# Patient Record
Sex: Male | Born: 1970 | Marital: Single
Health system: Southern US, Community
[De-identification: ages and names within clinical notes are randomized; demographics above are authoritative.]

---

## 2011-09-05 ENCOUNTER — Ambulatory Visit: Payer: Self-pay | Admitting: Oncology

## 2011-09-23 ENCOUNTER — Inpatient Hospital Stay: Payer: Self-pay | Admitting: Internal Medicine

## 2011-09-23 LAB — CBC
MCH: 27.1 pg (ref 26.0–34.0)
MCHC: 33.5 g/dL (ref 32.0–36.0)
MCV: 81 fL (ref 80–100)
Platelet: 87 10*3/uL — ABNORMAL LOW (ref 150–440)
RBC: 3.66 10*6/uL — ABNORMAL LOW (ref 4.40–5.90)
RDW: 16.3 % — ABNORMAL HIGH (ref 11.5–14.5)

## 2011-09-23 LAB — URINALYSIS, COMPLETE
Glucose,UR: NEGATIVE mg/dL (ref 0–75)
Hyaline Cast: 1
Ketone: NEGATIVE
Nitrite: NEGATIVE
Ph: 6 (ref 4.5–8.0)
Specific Gravity: 1.015 (ref 1.003–1.030)
WBC UR: 2 /HPF (ref 0–5)

## 2011-09-23 LAB — COMPREHENSIVE METABOLIC PANEL
Alkaline Phosphatase: 90 U/L (ref 50–136)
Anion Gap: 12 (ref 7–16)
BUN: 10 mg/dL (ref 7–18)
Calcium, Total: 8.2 mg/dL — ABNORMAL LOW (ref 8.5–10.1)
Chloride: 100 mmol/L (ref 98–107)
Co2: 25 mmol/L (ref 21–32)
EGFR (African American): >60
EGFR (Non-African Amer.): >60
Osmolality: 275 (ref 275–301)
Potassium: 2.7 mmol/L — ABNORMAL LOW (ref 3.5–5.1)
SGOT(AST): 61 U/L — ABNORMAL HIGH (ref 15–37)
SGPT (ALT): 29 U/L

## 2011-09-23 LAB — IRON AND TIBC
Iron Bind.Cap.(Total): 207 ug/dL — ABNORMAL LOW (ref 250–450)
Iron: 15 ug/dL — ABNORMAL LOW (ref 65–175)
Unbound Iron-Bind.Cap.: 192 ug/dL

## 2011-09-23 LAB — SEDIMENTATION RATE: Erythrocyte Sed Rate: 76 mm/hr — ABNORMAL HIGH (ref 0–15)

## 2011-09-23 LAB — RAPID INFLUENZA A&B ANTIGENS

## 2011-09-24 LAB — COMPREHENSIVE METABOLIC PANEL
Alkaline Phosphatase: 112 U/L (ref 50–136)
Anion Gap: 9 (ref 7–16)
BUN: 15 mg/dL (ref 7–18)
Bilirubin,Total: 1.8 mg/dL — ABNORMAL HIGH (ref 0.2–1.0)
Chloride: 101 mmol/L (ref 98–107)
Creatinine: 1.24 mg/dL (ref 0.60–1.30)
EGFR (African American): >60
Glucose: 111 mg/dL — ABNORMAL HIGH (ref 65–99)
SGOT(AST): 89 U/L — ABNORMAL HIGH (ref 15–37)
SGPT (ALT): 40 U/L
Total Protein: 7 g/dL (ref 6.4–8.2)

## 2011-09-24 LAB — HEMOGLOBIN A1C: Hemoglobin A1C: 5 % (ref 4.2–6.3)

## 2011-09-24 LAB — OCCULT BLOOD X 1 CARD TO LAB, STOOL: Occult Blood, Feces: NEGATIVE

## 2011-09-24 LAB — LIPID PANEL
Cholesterol: 126 mg/dL (ref 0–200)
Ldl Cholesterol, Calc: 84 mg/dL (ref 0–100)
Triglycerides: 131 mg/dL (ref 0–200)
VLDL Cholesterol, Calc: 26 mg/dL (ref 5–40)

## 2011-09-24 LAB — CBC WITH DIFFERENTIAL/PLATELET
Basophil %: 0 %
Eosinophil #: 0 10*3/uL (ref 0.0–0.7)
Eosinophil %: 0 %
HCT: 30.5 % — ABNORMAL LOW (ref 40.0–52.0)
HGB: 10.1 g/dL — ABNORMAL LOW (ref 13.0–18.0)
Lymphocyte #: 0.2 10*3/uL — ABNORMAL LOW (ref 1.0–3.6)
MCH: 27 pg (ref 26.0–34.0)
Monocyte %: 4.6 %
Neutrophil #: 2.3 10*3/uL (ref 1.4–6.5)
Neutrophil %: 88.5 %
Platelet: 56 10*3/uL — ABNORMAL LOW (ref 150–440)
RBC: 3.76 10*6/uL — ABNORMAL LOW (ref 4.40–5.90)

## 2011-09-24 LAB — PROTIME-INR
INR: 1.3
Prothrombin Time: 16.3 secs — ABNORMAL HIGH (ref 11.5–14.7)

## 2011-09-24 LAB — URINE CULTURE

## 2011-09-24 LAB — CK: CK, Total: 42 U/L (ref 35–232)

## 2011-09-24 LAB — FOLATE: Folic Acid: 6.4 ng/mL (ref 3.1–100.0)

## 2011-09-25 LAB — BASIC METABOLIC PANEL
Anion Gap: 10 (ref 7–16)
BUN: 10 mg/dL (ref 7–18)
Calcium, Total: 8 mg/dL — ABNORMAL LOW (ref 8.5–10.1)
Chloride: 102 mmol/L (ref 98–107)
Co2: 24 mmol/L (ref 21–32)
Creatinine: 0.87 mg/dL (ref 0.60–1.30)

## 2011-09-25 LAB — RAPID HIV-1/2 QL/CONFIRM: HIV-1/2,Rapid Ql: NEGATIVE

## 2011-09-25 LAB — MAGNESIUM: Magnesium: 1.7 mg/dL — ABNORMAL LOW

## 2011-09-26 LAB — BASIC METABOLIC PANEL
Anion Gap: 11 (ref 7–16)
BUN: 7 mg/dL (ref 7–18)
Creatinine: 0.86 mg/dL (ref 0.60–1.30)
EGFR (African American): >60
EGFR (Non-African Amer.): >60
Glucose: 94 mg/dL (ref 65–99)
Osmolality: 275 (ref 275–301)

## 2011-09-26 LAB — CBC WITH DIFFERENTIAL/PLATELET
Eosinophil %: 0.2 %
HCT: 24.3 % — ABNORMAL LOW (ref 40.0–52.0)
HGB: 8.1 g/dL — ABNORMAL LOW (ref 13.0–18.0)
Lymphocyte #: 0.6 10*3/uL — ABNORMAL LOW (ref 1.0–3.6)
MCH: 27.1 pg (ref 26.0–34.0)
MCV: 81 fL (ref 80–100)
Monocyte #: 0.2 10*3/uL (ref 0.0–0.7)
Monocyte %: 10.6 %
Neutrophil #: 1 10*3/uL — ABNORMAL LOW (ref 1.4–6.5)
RBC: 2.99 10*6/uL — ABNORMAL LOW (ref 4.40–5.90)
RDW: 17.4 % — ABNORMAL HIGH (ref 11.5–14.5)
WBC: 1.8 10*3/uL — CL (ref 3.8–10.6)

## 2011-09-27 LAB — COMPREHENSIVE METABOLIC PANEL
Albumin: 2.4 g/dL — ABNORMAL LOW (ref 3.4–5.0)
Anion Gap: 10 (ref 7–16)
BUN: 7 mg/dL (ref 7–18)
Bilirubin,Total: 0.8 mg/dL (ref 0.2–1.0)
Calcium, Total: 7.7 mg/dL — ABNORMAL LOW (ref 8.5–10.1)
Creatinine: 0.89 mg/dL (ref 0.60–1.30)
EGFR (African American): >60
Glucose: 107 mg/dL — ABNORMAL HIGH (ref 65–99)
Osmolality: 272 (ref 275–301)
SGOT(AST): 92 U/L — ABNORMAL HIGH (ref 15–37)
Total Protein: 5.9 g/dL — ABNORMAL LOW (ref 6.4–8.2)

## 2011-09-27 LAB — CBC WITH DIFFERENTIAL/PLATELET
Basophil #: 0 10*3/uL (ref 0.0–0.1)
Basophil %: 0.1 %
Eosinophil #: 0 10*3/uL (ref 0.0–0.7)
HGB: 7.9 g/dL — ABNORMAL LOW (ref 13.0–18.0)
Lymphocyte %: 26.8 %
MCH: 27.2 pg (ref 26.0–34.0)
MCHC: 33.6 g/dL (ref 32.0–36.0)
Monocyte #: 0.2 10*3/uL (ref 0.0–0.7)
Monocyte %: 12 %
Neutrophil %: 60.9 %
Platelet: 52 10*3/uL — ABNORMAL LOW (ref 150–440)
RBC: 2.91 10*6/uL — ABNORMAL LOW (ref 4.40–5.90)
RDW: 17.2 % — ABNORMAL HIGH (ref 11.5–14.5)

## 2011-09-28 LAB — CBC WITH DIFFERENTIAL/PLATELET
Basophil #: 0 10*3/uL (ref 0.0–0.1)
Basophil %: 0.1 %
Eosinophil %: 0.3 %
HCT: 30 % — ABNORMAL LOW (ref 40.0–52.0)
HGB: 9.8 g/dL — ABNORMAL LOW (ref 13.0–18.0)
Lymphocyte %: 15.5 %
MCHC: 32.8 g/dL (ref 32.0–36.0)
Monocyte %: 10.6 %
Neutrophil #: 1.4 10*3/uL (ref 1.4–6.5)
Neutrophil %: 73.5 %
Platelet: 50 10*3/uL — ABNORMAL LOW (ref 150–440)
RDW: 17.8 % — ABNORMAL HIGH (ref 11.5–14.5)
WBC: 1.9 10*3/uL — CL (ref 3.8–10.6)

## 2011-09-28 LAB — URINALYSIS, COMPLETE
Bacteria: NONE SEEN
Ketone: NEGATIVE
Nitrite: NEGATIVE
Ph: 6 (ref 4.5–8.0)
Protein: NEGATIVE
RBC,UR: 1 /HPF (ref 0–5)
Specific Gravity: 1.012 (ref 1.003–1.030)
Squamous Epithelial: <1
WBC UR: NONE SEEN /HPF (ref 0–5)

## 2011-09-28 LAB — BASIC METABOLIC PANEL
BUN: 7 mg/dL (ref 7–18)
Chloride: 100 mmol/L (ref 98–107)
Creatinine: 0.89 mg/dL (ref 0.60–1.30)
Potassium: 4.2 mmol/L (ref 3.5–5.1)
Sodium: 137 mmol/L (ref 136–145)

## 2011-09-28 LAB — CLOSTRIDIUM DIFFICILE BY PCR

## 2011-09-28 LAB — CULTURE, BLOOD (SINGLE)

## 2011-09-29 LAB — CBC WITH DIFFERENTIAL/PLATELET
Basophil #: 0 10*3/uL (ref 0.0–0.1)
Eosinophil #: 0 10*3/uL (ref 0.0–0.7)
Lymphocyte %: 25.5 %
MCH: 27 pg (ref 26.0–34.0)
MCHC: 33.2 g/dL (ref 32.0–36.0)
MCV: 81 fL (ref 80–100)
Monocyte %: 10.8 %
Neutrophil #: 1 10*3/uL — ABNORMAL LOW (ref 1.4–6.5)
Neutrophil %: 62.9 %
Platelet: 43 10*3/uL — ABNORMAL LOW (ref 150–440)
RBC: 2.99 10*6/uL — ABNORMAL LOW (ref 4.40–5.90)
RDW: 17.3 % — ABNORMAL HIGH (ref 11.5–14.5)
WBC: 1.6 10*3/uL — CL (ref 3.8–10.6)

## 2011-09-29 LAB — COMPREHENSIVE METABOLIC PANEL
Albumin: 2.4 g/dL — ABNORMAL LOW (ref 3.4–5.0)
Alkaline Phosphatase: 164 U/L — ABNORMAL HIGH (ref 50–136)
Bilirubin,Total: 1.1 mg/dL — ABNORMAL HIGH (ref 0.2–1.0)
Calcium, Total: 8.1 mg/dL — ABNORMAL LOW (ref 8.5–10.1)
Chloride: 102 mmol/L (ref 98–107)
Co2: 26 mmol/L (ref 21–32)
Creatinine: 0.77 mg/dL (ref 0.60–1.30)
EGFR (African American): >60
EGFR (Non-African Amer.): >60
Glucose: 102 mg/dL — ABNORMAL HIGH (ref 65–99)
SGOT(AST): 113 U/L — ABNORMAL HIGH (ref 15–37)
SGPT (ALT): 50 U/L
Sodium: 138 mmol/L (ref 136–145)

## 2011-09-30 LAB — CBC WITH DIFFERENTIAL/PLATELET
Basophil %: 0.4 %
Eosinophil #: 0 10*3/uL (ref 0.0–0.7)
HCT: 26.2 % — ABNORMAL LOW (ref 40.0–52.0)
HGB: 8.7 g/dL — ABNORMAL LOW (ref 13.0–18.0)
Lymphocyte #: 0.3 10*3/uL — ABNORMAL LOW (ref 1.0–3.6)
Lymphocyte %: 18.4 %
MCH: 27.2 pg (ref 26.0–34.0)
MCHC: 33.3 g/dL (ref 32.0–36.0)
MCV: 82 fL (ref 80–100)
Monocyte %: 7.8 %
Neutrophil #: 1.3 10*3/uL — ABNORMAL LOW (ref 1.4–6.5)
Platelet: 47 10*3/uL — ABNORMAL LOW (ref 150–440)
RDW: 17 % — ABNORMAL HIGH (ref 11.5–14.5)
WBC: 1.7 10*3/uL — CL (ref 3.8–10.6)

## 2011-10-01 LAB — CULTURE, BLOOD (SINGLE)

## 2011-10-02 LAB — CBC WITH DIFFERENTIAL/PLATELET
Basophil #: 0 10*3/uL (ref 0.0–0.1)
Basophil %: 0 %
Eosinophil #: 0 10*3/uL (ref 0.0–0.7)
HCT: 25.6 % — ABNORMAL LOW (ref 40.0–52.0)
HGB: 8.5 g/dL — ABNORMAL LOW (ref 13.0–18.0)
Lymphocyte %: 19.5 %
MCH: 26.7 pg (ref 26.0–34.0)
MCHC: 33.1 g/dL (ref 32.0–36.0)
Monocyte #: 0.2 10*3/uL (ref 0.0–0.7)
Neutrophil #: 1.5 10*3/uL (ref 1.4–6.5)
Neutrophil %: 72 %
Platelet: 44 10*3/uL — ABNORMAL LOW (ref 150–440)

## 2011-10-02 LAB — BASIC METABOLIC PANEL
Anion Gap: 10 (ref 7–16)
BUN: 7 mg/dL (ref 7–18)
Co2: 26 mmol/L (ref 21–32)
Creatinine: 0.83 mg/dL (ref 0.60–1.30)
EGFR (African American): >60
EGFR (Non-African Amer.): >60
Osmolality: 268 (ref 275–301)
Sodium: 135 mmol/L — ABNORMAL LOW (ref 136–145)

## 2011-10-06 ENCOUNTER — Ambulatory Visit: Payer: Self-pay | Admitting: Oncology

## 2011-10-13 ENCOUNTER — Ambulatory Visit: Payer: Self-pay | Admitting: Internal Medicine

## 2011-10-13 DIAGNOSIS — Z0289 Encounter for other administrative examinations: Secondary | ICD-10-CM

## 2012-08-22 ENCOUNTER — Emergency Department: Payer: Self-pay | Admitting: Internal Medicine

## 2013-01-23 IMAGING — CT CT CHEST-ABD W/ CM
2 series · 13 of 32 positions shown, 19 images · IV contrast (APPLIED)
Comparison: none

REASON FOR EXAM: sob
COMMENTS:

PROCEDURE:     CT  - CT PE CHEST / ABDOMEN WITH  - September 26, 2011  [DATE]
RESULT:     History: Shortness of breath. Splenomegaly.
Comparison Study: Chest x-ray 09/23/2011.

[Series 4: soft tissue · axial · 0.96mm/px · 1 of 104 slices shown]
[im 13/104  soft-tissue]
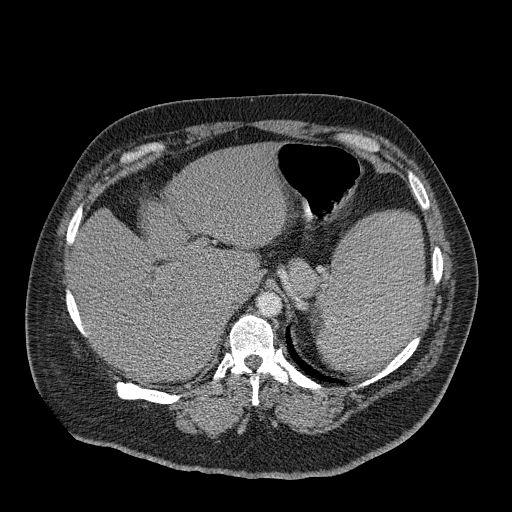

[Series 5: abdomen · axial · 0.98mm/px · z∈[-848,-518]mm · 12 of 80 slices shown, 18 images]
[im 7/80  soft-tissue]
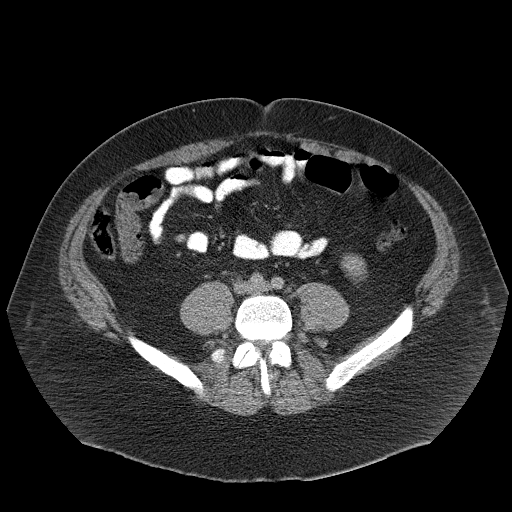
[im 7/80  bone]
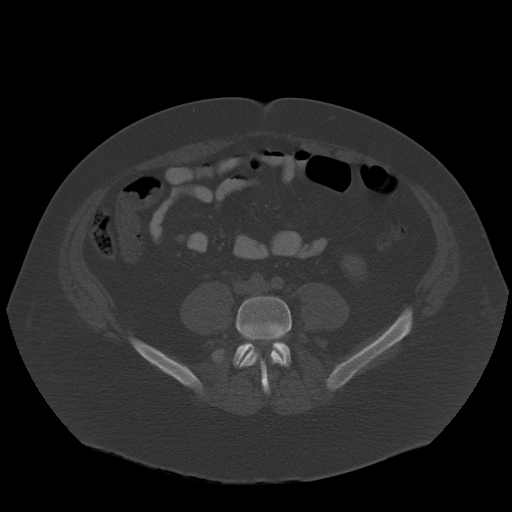
[im 13/80  soft-tissue]
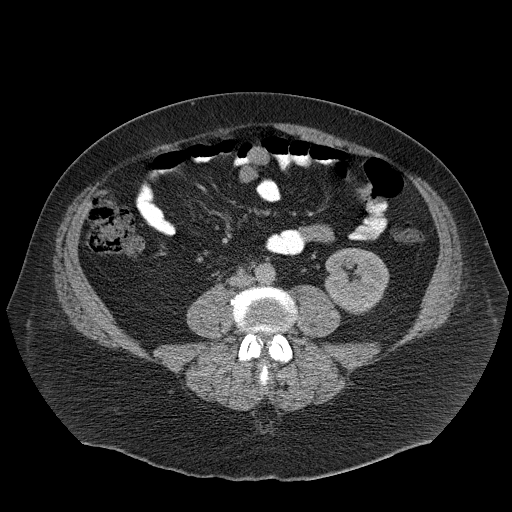
[im 19/80  soft-tissue]
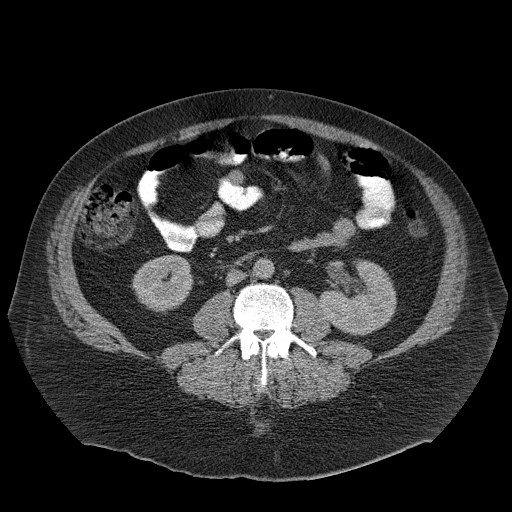
[im 25/80  soft-tissue]
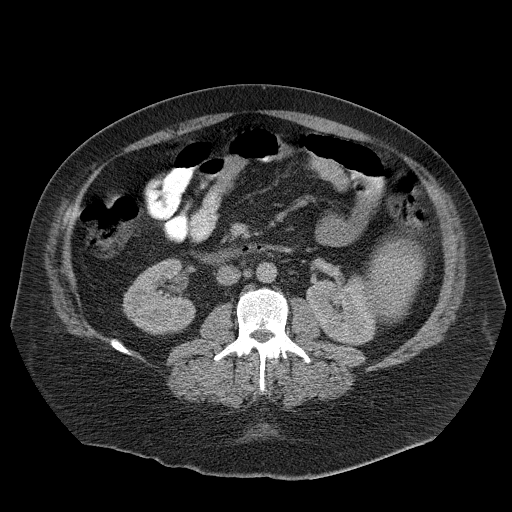
[im 31/80  soft-tissue]
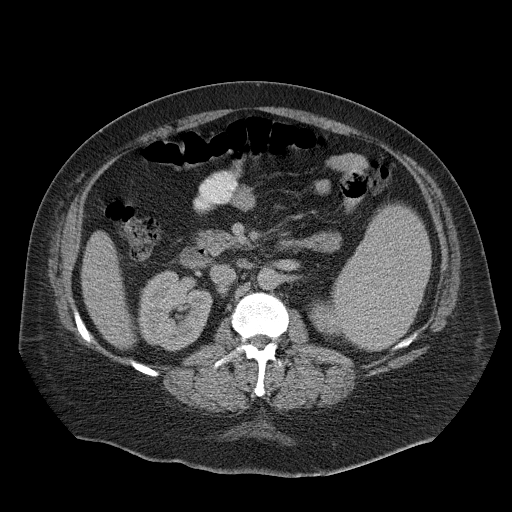
[im 37/80  soft-tissue]
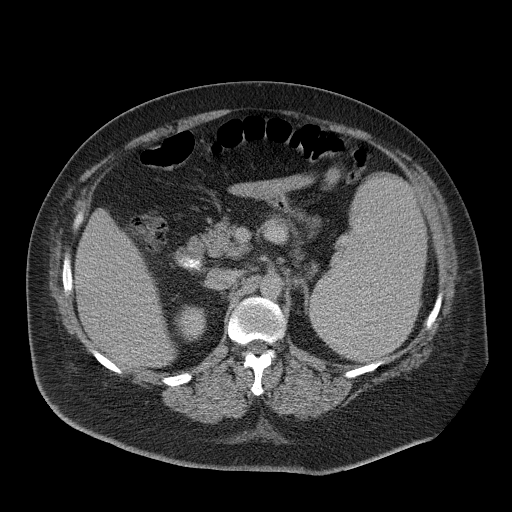
[im 43/80  soft-tissue]
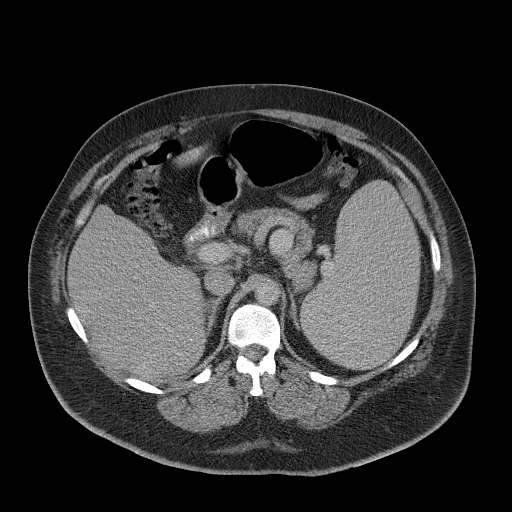
[im 49/80  soft-tissue]
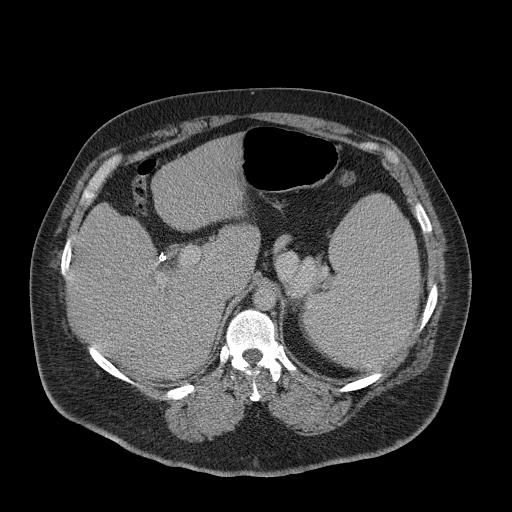
[im 55/80  soft-tissue]
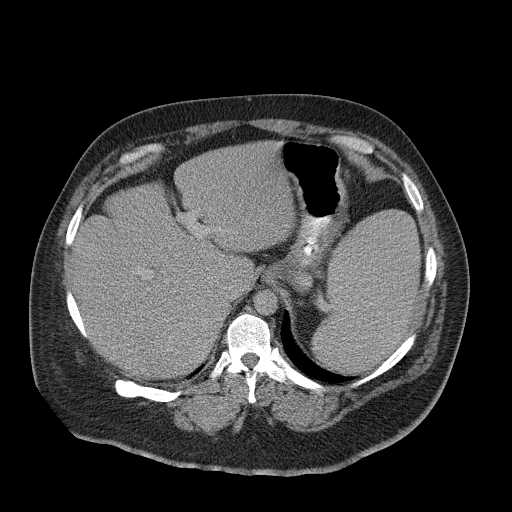
[im 55/80  lung]
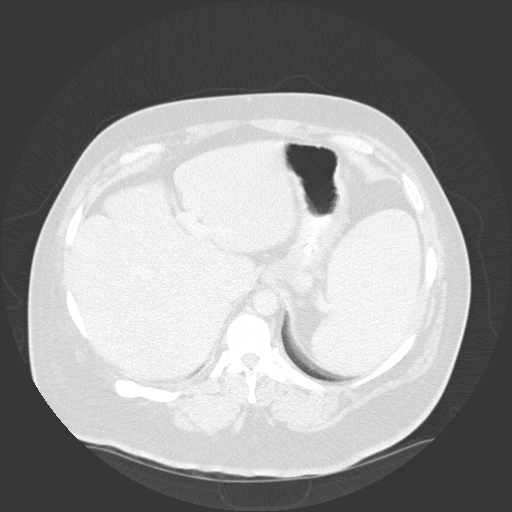
[im 55/80  bone]
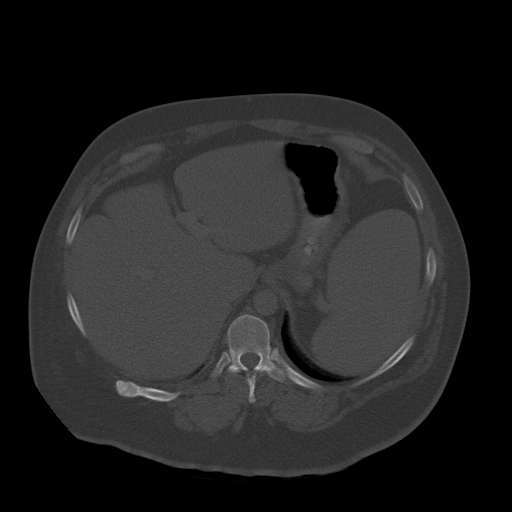
[im 61/80  soft-tissue]
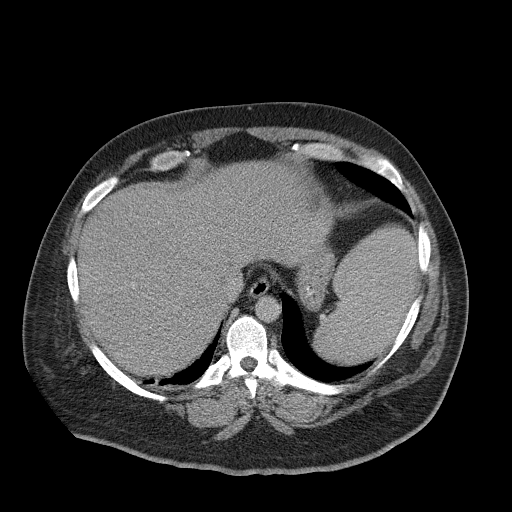
[im 61/80  lung]
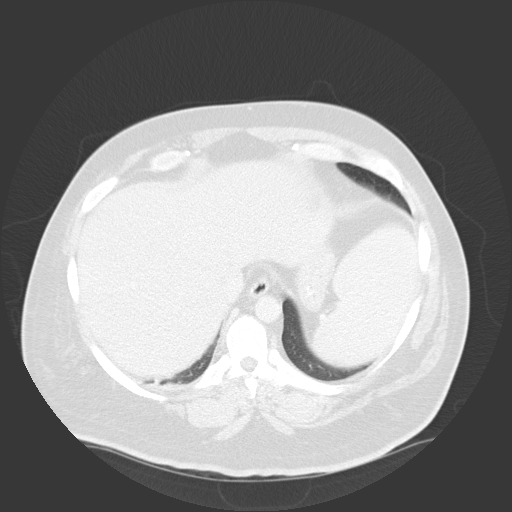
[im 67/80  soft-tissue]
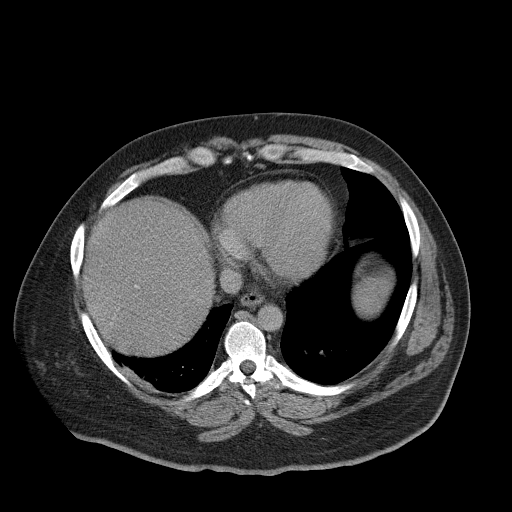
[im 67/80  lung]
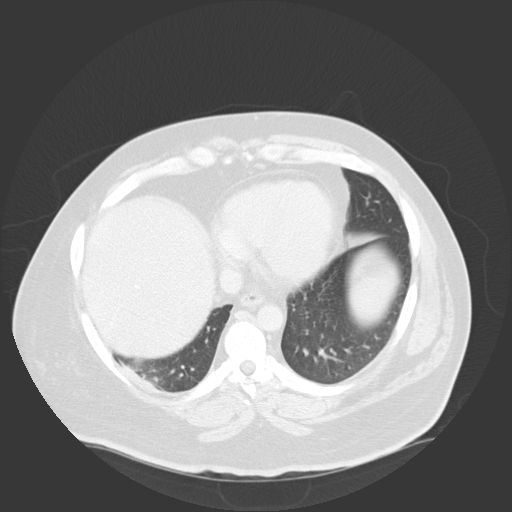
[im 73/80  soft-tissue]
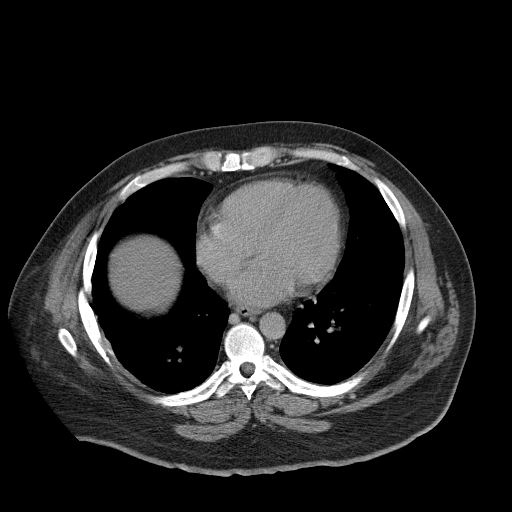
[im 73/80  lung]
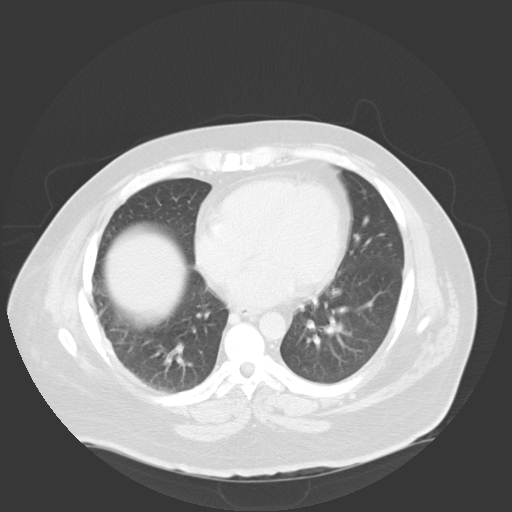

[13 of 32 positions shown; findings below may reference images not displayed]

FINDINGS: Standard CT of the chest and abdomen obtained. 100 cc of
Rsovue-E31 administered.Thoracic aorta unremarkable. No pulmonary embolus.
Heart size is normal. Large airways patent. Lungs are clear of infiltrates.
Deformity noted of the right posterior ribs. This may be from prior trauma.

Liver normal. Prior cholecystectomy. Pancreas normal. Adrenals normal.
Kidneys normal. Splenomegaly is noted. Spleen measures approximately 20 cm
in length. Portal vein is patent. Prominence of the splenic vein noted.
Splenic vein is patent. Adrenals normal. Kidneys are normal. No bowel
distention. Visualized appendix is normal. Abdominal aorta is normal. No
retroperitoneal adenopathy.
IMPRESSION: 1. No evidence of pulmonary embolus.
2. Severe splenomegaly.

## 2013-07-14 ENCOUNTER — Inpatient Hospital Stay: Payer: Self-pay | Admitting: Internal Medicine

## 2013-07-14 LAB — COMPREHENSIVE METABOLIC PANEL
Albumin: 3 g/dL — ABNORMAL LOW (ref 3.4–5.0)
Alkaline Phosphatase: 133 U/L (ref 50–136)
Calcium, Total: 8.5 mg/dL (ref 8.5–10.1)
Chloride: 91 mmol/L — ABNORMAL LOW (ref 98–107)
Co2: 28 mmol/L (ref 21–32)
EGFR (African American): >60
EGFR (Non-African Amer.): >60
Glucose: 123 mg/dL — ABNORMAL HIGH (ref 65–99)
Potassium: 3.3 mmol/L — ABNORMAL LOW (ref 3.5–5.1)

## 2013-07-14 LAB — CK TOTAL AND CKMB (NOT AT ARMC)
CK, Total: 347 U/L — ABNORMAL HIGH (ref 35–232)
CK-MB: 1.5 ng/mL (ref 0.5–3.6)

## 2013-07-14 LAB — URINALYSIS, COMPLETE
Ketone: NEGATIVE
Nitrite: NEGATIVE
Protein: 100
WBC UR: 15 /HPF (ref 0–5)

## 2013-07-14 LAB — CBC
HCT: 29.5 % — ABNORMAL LOW (ref 40.0–52.0)
MCH: 27.6 pg (ref 26.0–34.0)
MCHC: 35.7 g/dL (ref 32.0–36.0)
MCV: 77 fL — ABNORMAL LOW (ref 80–100)
RBC: 3.82 10*6/uL — ABNORMAL LOW (ref 4.40–5.90)
RDW: 17.2 % — ABNORMAL HIGH (ref 11.5–14.5)
WBC: 1.6 10*3/uL — CL (ref 3.8–10.6)

## 2013-07-14 LAB — TROPONIN I: Troponin-I: <0.02 ng/mL

## 2013-07-15 ENCOUNTER — Ambulatory Visit: Payer: Self-pay | Admitting: Oncology

## 2013-07-15 LAB — CBC WITH DIFFERENTIAL/PLATELET
Basophil #: 0 10*3/uL (ref 0.0–0.1)
Eosinophil #: 0 10*3/uL (ref 0.0–0.7)
Eosinophil %: 0.4 %
HGB: 9.6 g/dL — ABNORMAL LOW (ref 13.0–18.0)
MCH: 27.5 pg (ref 26.0–34.0)
Monocyte %: 7.4 %
Neutrophil %: 86.5 %
Platelet: 18 10*3/uL — CL (ref 150–440)
RBC: 3.48 10*6/uL — ABNORMAL LOW (ref 4.40–5.90)
RDW: 17.2 % — ABNORMAL HIGH (ref 11.5–14.5)
WBC: 1.8 10*3/uL — CL (ref 3.8–10.6)

## 2013-07-15 LAB — BASIC METABOLIC PANEL
Anion Gap: 9 (ref 7–16)
BUN: 14 mg/dL (ref 7–18)
Co2: 25 mmol/L (ref 21–32)
Creatinine: 1.29 mg/dL (ref 0.60–1.30)
EGFR (African American): >60
EGFR (Non-African Amer.): >60
Glucose: 116 mg/dL — ABNORMAL HIGH (ref 65–99)

## 2013-07-15 LAB — MAGNESIUM: Magnesium: 1.7 mg/dL — ABNORMAL LOW

## 2013-07-15 LAB — IRON AND TIBC
Iron Bind.Cap.(Total): 177 ug/dL — ABNORMAL LOW (ref 250–450)
Iron Saturation: 10 %
Iron: 18 ug/dL — ABNORMAL LOW (ref 65–175)

## 2013-07-15 LAB — TSH: Thyroid Stimulating Horm: 7.24 u[IU]/mL — ABNORMAL HIGH

## 2013-07-16 LAB — CBC WITH DIFFERENTIAL/PLATELET
Basophil #: 0 10*3/uL (ref 0.0–0.1)
Basophil %: 0.3 %
Eosinophil #: 0 10*3/uL (ref 0.0–0.7)
HCT: 23.9 % — ABNORMAL LOW (ref 40.0–52.0)
HGB: 8.4 g/dL — ABNORMAL LOW (ref 13.0–18.0)
MCH: 27.6 pg (ref 26.0–34.0)
Monocyte %: 8.2 %
Neutrophil %: 68.4 %
RBC: 3.05 10*6/uL — ABNORMAL LOW (ref 4.40–5.90)
RDW: 17.3 % — ABNORMAL HIGH (ref 11.5–14.5)

## 2013-07-16 LAB — COMPREHENSIVE METABOLIC PANEL
Albumin: 2.3 g/dL — ABNORMAL LOW (ref 3.4–5.0)
Alkaline Phosphatase: 123 U/L (ref 50–136)
Anion Gap: 4 — ABNORMAL LOW (ref 7–16)
Bilirubin,Total: 1.4 mg/dL — ABNORMAL HIGH (ref 0.2–1.0)
Chloride: 102 mmol/L (ref 98–107)
Co2: 27 mmol/L (ref 21–32)
Creatinine: 0.83 mg/dL (ref 0.60–1.30)
Glucose: 104 mg/dL — ABNORMAL HIGH (ref 65–99)
Potassium: 3.6 mmol/L (ref 3.5–5.1)
SGOT(AST): 145 U/L — ABNORMAL HIGH (ref 15–37)
SGPT (ALT): 67 U/L (ref 12–78)
Sodium: 133 mmol/L — ABNORMAL LOW (ref 136–145)

## 2013-07-16 LAB — STOOL CULTURE

## 2013-07-17 LAB — CBC WITH DIFFERENTIAL/PLATELET
Basophil: 1 %
Eosinophil: 1 %
HCT: 24.5 % — ABNORMAL LOW (ref 40.0–52.0)
HGB: 8.4 g/dL — ABNORMAL LOW (ref 13.0–18.0)
Lymphocytes: 28 %
MCHC: 34.3 g/dL (ref 32.0–36.0)
Myelocyte: 1 %
Platelet: 39 10*3/uL — ABNORMAL LOW (ref 150–440)
RBC: 3.08 10*6/uL — ABNORMAL LOW (ref 4.40–5.90)
RDW: 17.4 % — ABNORMAL HIGH (ref 11.5–14.5)
Segmented Neutrophils: 59 %

## 2013-07-17 LAB — COMPREHENSIVE METABOLIC PANEL
Albumin: 2.4 g/dL — ABNORMAL LOW (ref 3.4–5.0)
Calcium, Total: 8 mg/dL — ABNORMAL LOW (ref 8.5–10.1)
Creatinine: 0.74 mg/dL (ref 0.60–1.30)
EGFR (African American): >60
Glucose: 105 mg/dL — ABNORMAL HIGH (ref 65–99)
Osmolality: 273 (ref 275–301)
SGOT(AST): 119 U/L — ABNORMAL HIGH (ref 15–37)
SGPT (ALT): 64 U/L (ref 12–78)
Sodium: 137 mmol/L (ref 136–145)

## 2013-07-17 LAB — CLOSTRIDIUM DIFFICILE(ARMC)

## 2013-07-19 LAB — CULTURE, BLOOD (SINGLE)

## 2013-07-24 ENCOUNTER — Ambulatory Visit: Payer: Self-pay | Admitting: Oncology

## 2013-08-04 ENCOUNTER — Ambulatory Visit: Payer: Self-pay | Admitting: Oncology

## 2013-09-25 ENCOUNTER — Ambulatory Visit: Payer: Self-pay | Admitting: Oncology

## 2013-09-25 LAB — CBC CANCER CENTER
BASOS ABS: 0.1 x10 3/mm (ref 0.0–0.1)
Basophil %: 0.9 %
EOS ABS: 0.1 x10 3/mm (ref 0.0–0.7)
Eosinophil %: 1.1 %
HCT: 45 % (ref 40.0–52.0)
HGB: 14.7 g/dL (ref 13.0–18.0)
LYMPHS ABS: 2 x10 3/mm (ref 1.0–3.6)
LYMPHS PCT: 24.6 %
MCH: 28.6 pg (ref 26.0–34.0)
MCHC: 32.7 g/dL (ref 32.0–36.0)
MCV: 88 fL (ref 80–100)
Monocyte #: 0.4 x10 3/mm (ref 0.2–1.0)
Monocyte %: 4.9 %
NEUTROS ABS: 5.5 x10 3/mm (ref 1.4–6.5)
NEUTROS PCT: 68.5 %
PLATELETS: 156 x10 3/mm (ref 150–440)
RBC: 5.13 10*6/uL (ref 4.40–5.90)
RDW: 15.6 % — AB (ref 11.5–14.5)
WBC: 8.1 x10 3/mm (ref 3.8–10.6)

## 2013-10-05 ENCOUNTER — Ambulatory Visit: Payer: Self-pay | Admitting: Oncology

## 2014-02-05 ENCOUNTER — Ambulatory Visit: Payer: Self-pay | Admitting: Physician Assistant

## 2014-12-25 NOTE — Consult Note (Signed)
I have seen and examined Mr Robert Steele and agree with Robert Steele's a/p.    His diarrhea is acute in onset and appears to be resolving today.  Two stool today he reports and not sure if they were loose or not.   Suspect this was an acute gastroenteritis. Await stool studies but doubt they will be positive.   Would consider downgrading his abx to cipro/flagyl given improvement today.   He does have elev t.bili (2) and AST 182, ALT 82.  Some of this liver enzyme abnormality may be due to acute infection, but we will obtain u/s with dopplers.  Will need outpt f/u of these liver enzymes and if they don't resolve, futher w/u in GI clinic.    Also agree with heme consult given severe pancytopenia.  Will cont to follow.     Electronic Signatures: Dow Adolphein, Matthew (MD)  (Signed on 11-Nov-14 16:43)  Authored  Last Updated: 11-Nov-14 16:43 by Dow Adolphein, Matthew (MD)

## 2014-12-25 NOTE — H&P (Signed)
PATIENT NAME:  Robert Steele, Robert MR#:  [id number] DATE OF BIRTH:  1971/04/06  DATE OF ADMISSION:  07/14/2013  PRIMARY CARE PHYSICIAN: Does not have one.   CHIEF COMPLAINT: Acute diarrhea.   HISTORY OF PRESENT ILLNESS: This is a 44 year old male who presents to the hospital due to acute diarrhea, ongoing now for the past 4 days. The patient says he has about 8 bowel movements daily for the past 4 days. The patient says stool is loose and somewhat creamish in color. He denies any blood in the stools. He denies any nausea or vomiting, denies any abdominal pain. The patient says that no matter what he eats or drinks, it shortly comes out thereafter. The patient denies any fevers or chills, any sick contacts. Since the patient's symptoms were not improving, he came to the ER for further evaluation. The patient was noted to be persistently tachycardic with heart rates in the low 100s, also noted to be hyponatremic, hypokalemic and has significant pancytopenia which is apparently chronic for the patient. Hospitalist services were contacted for further treatment and evaluation.   REVIEW OF SYSTEMS: CONSTITUTIONAL: No documented fever. Positive generalized weakness. No weight gain or weight loss.  EYES: No blurred or double vision.  ENT: No tinnitus. No postnasal drip. No redness of the oropharynx.  RESPIRATORY: No cough, no wheeze, no hemoptysis, no dyspnea.  CARDIOVASCULAR: No chest pain. No orthopnea. No palpitations. No syncope.  GASTROINTESTINAL: No nausea, no vomiting. Positive diarrhea. No abdominal pain. No melena or hematochezia.  GENITOURINARY: No dysuria or hematuria.  ENDOCRINE: No polyuria or nocturia. No heat or cold intolerance.  HEMATOLOGIC: No anemia, no bruising, no bleeding.  INTEGUMENTARY: No rashes. No lesions.  MUSCULOSKELETAL: No arthritis. No swelling. No gout.  NEUROLOGIC: No numbness or tingling. No ataxia. No seizure-type activity.  PSYCHIATRIC: No anxiety, no insomnia, no ADD.    PAST MEDICAL HISTORY: Consistent with juvenile rheumatoid arthritis, history of meningeal encephalitis, morbid obesity, chronic pancytopenia.   ALLERGIES: TYLENOL.   SOCIAL HISTORY: No smoking. No alcohol abuse. No illicit drug abuse. Lives by himself.   FAMILY HISTORY: The patient's mother is deceased. He does not know what she died from. Father is alive, has chronic medical problems including chronic back problems.   CURRENT MEDICATIONS: He is currently on no medications.   PHYSICAL EXAMINATION:  VITAL SIGNS:  Temperature is 98.3, pulse 116, respirations 32, blood pressure 112/63, sats 96% on room air.  GENERAL: He is a morbidly obese male lying in bed but in no apparent distress.  HEAD, EYES, EARS, NOSE, THROAT: He is atraumatic, normocephalic. Extraocular muscles are intact. Pupils equal and reactive to light. Sclerae anicteric. No conjunctival injection. No pharyngeal erythema.  NECK: Supple. There is no jugular venous distention. No bruits. No lymphadenopathy or thyromegaly.  HEART: Regular rate and rhythm. No murmurs. No rubs or clicks.  LUNGS: Clear to auscultation bilaterally. No rales or rhonchi. No wheezes.  ABDOMEN: Soft, flat, nontender, nondistended. Has good bowel sounds. No hepatosplenomegaly  appreciated.  EXTREMITIES: No evidence of any cyanosis, clubbing or peripheral edema. Has +2 pedal and radial pulses bilaterally.  NEUROLOGICAL: He is alert, awake and oriented x 3, globally weak, moves all extremities spontaneously. No other focal motor or sensory deficits appreciated bilaterally.  SKIN: Moist and warm with no rashes appreciated.  LYMPHATIC: There is no cervical or axillary lymphadenopathy.   LABORATORY DATA: Serum glucose 123, BUN 13, creatinine 1.18, sodium 124, potassium 3.3, chloride 91, bicarb 28. The patient's AST 182, ALT 85, albumin  3.0. Troponin less than 0.02. White cell count 1.6, hemoglobin 10.5, hematocrit 29.5, platelet count 24,000, MCV 77. Urinalysis  is within normal limits.   IMAGING:  The patient did have a chest x-ray done which showed no evidence of acute cardiopulmonary disease.   ASSESSMENT AND PLAN: This is a 44 year old male with a history of juvenile rheumatoid  arthritis, history of meningeal encephalitis, morbid obesity, chronic pancytopenia who presents to the hospital due to acute diarrhea now for the past 3 to 4 days.  1.  Acute diarrhea. The exact etiology of the diarrhea is unclear whether it is viral versus inflammatory or possibly bacterial in nature. I will continue supportive care with IV fluids, place him on some p.r.n. Imodium and a clear liquid diet for now. I will check stool for Clostridium difficile and comprehensive culture. The patient has not been on any recent antibiotics. Questionable if this is related to his underlying pancytopenia. He is currently afebrile, has no abdominal pain  and is hemodynamically stable. Therefore, I will hold off on giving antibiotics.  2.  Hyponatremia. This was likely hypovolemic hyponatremia from his volume loss and diarrhea. I will hydrate him with IV fluids, follow his sodium.  3.  Hypokalemia, also likely related to the diarrhea. I will go ahead and place him on oral supplements and repeat his potassium in the morning.  4.  Pancytopenia. This is chronic for the patient and supposedly related to his juvenile rheumatoid arthritis. The patient has had no followup with the hematology oncologist in now years. I will go ahead and get a hematology consult for now.  5.  History of meningeal encephalitis with difficulty walking. I will get a physical therapy consult to assess his mobility.  6.  CODE STATUS: The patient is a full code.   TIME SPENT: 50 minutes.    ____________________________ Rolly Pancake. Cherlynn Kaiser, MD vjs:cs D: 07/14/2013 15:31:00 ET T: 07/14/2013 15:53:52 ET JOB#: [id number]  cc: Rolly Pancake. Cherlynn Kaiser, MD, <Dictator> Houston Siren MD ELECTRONICALLY SIGNED 07/23/2013 13:43

## 2014-12-25 NOTE — Consult Note (Signed)
PATIENT NAME:  Robert Steele, Robert Steele MR#:  [id number] DATE OF BIRTH:  1971/08/19  DATE OF CONSULTATION:  07/15/2013  REFERRING PHYSICIAN:  Dr. Darvin Neighbours CONSULTING PHYSICIAN:  Corky Sox. Rohnan Bartleson, PA-C  REASON FOR CONSULTATION: Diarrhea, fever and pancytopenia.   ATTENDING GASTROENTEROLOGIST: Dr. Rayann Heman.  HISTORY OF PRESENT ILLNESS: This pleasant 44 year old gentleman who initially presented to the ER with diarrhea for the past five days. He is having about eight bowel movements per day. Does seem to be postprandial and soon as he eats something he has to run to the bathroom. There is associated urgency. Per report, it does seem that he reported history of cream -colored stools that were off white in color. No evidence of blood. He is also denying any abdominal pain, nausea, vomiting or discomfort whatsoever. No recent travel or sick contacts. He states that very common when he gets sick he spikes a fever and he has been running a low-grade between 99 and 100 although this his most recent temperature was 98.3. He is also significantly pancytopenic and he has been followed by the [facility] for evaluation of this with negative work-up thus far. He has not yet had a bone marrow biopsy. Also upon admission he was found to have elevated LFTs with a bilirubin of 2.1 and an AST of 182. The patient denies any history of liver disease. He denies any alcohol intake. Unfortunately he is not the best historian and cannot tell me how many times he has had to use the bathroom yet today. He also cannot tell me the color of his stool or whether or not he has seen any blood. He is tolerating a clear liquid diet. He has never had an EGD or colonoscopy. No unintentional weight changes. He also has a history of iron deficiency anemia for which he has received iron infusions at the [facility]. No history of prior endoscopies.   PAST MEDICAL HISTORY: Juvenile rheumatoid arthritis, meningeal encephalitis, morbid obesity, chronic  pancytopenia.   PAST SURGICAL HISTORY: Cholecystectomy and tracheostomy.   SOCIAL HISTORY: The patient denies any alcohol, tobacco or illicit drug use.   FAMILY HISTORY: No known family history of GI malignancy, colon polyps or IBD.   HOME MEDICATIONS: None.   ALLERGIES: TYLENOL.   REVIEW OF SYSTEMS: Ten system review of systems was obtained on the patient. Pertinent positives are mentioned above and otherwise negative.   OBJECTIVE: VITAL SIGNS: Blood pressure 128/82, heart rate 109, respirations 20, temperature 98.3, bedside pulse oximetry 96% on room air.  GENERAL: This is a pleasant 44 year old gentleman resting quietly and comfortably in bed in no acute distress. Alert and oriented x 3.  HEAD: Atraumatic, normocephalic.  NECK: Supple. No lymphadenopathy noted.  HEENT: Sclerae anicteric. Mucous membranes moist.  LUNGS: Respirations are even and unlabored. Clear to auscultation bilateral anterior lung fields.  CARDIAC: Regular rate and rhythm. S1, S2 noted.  ABDOMEN: Soft, nontender, nondistended. Normoactive bowel sounds noted in the four quadrants. No guarding or rebound. No masses, hernias or organomegaly appreciated. However, exam is significantly limited secondary to morbidly obese habitus.  RECTAL: Deferred.  PSYCHIATRIC: Appropriate mood and affect.  EXTREMITIES: Negative for lower extremity edema, 2+ pulses noted bilaterally.   LABORATORY DATA: White blood cells 1.8, hemoglobin 9.6, platelets 18, hematocrit 27.4, MCV 79, bilirubin 2.1, ALT 85, AST 182. Sodium 130, potassium 3, BUN 14, creatinine 1.29, glucose 116, TSH 7.24, magnesium 1.7. Troponins are less than 0.02.   IMAGING: Chest x-ray was obtained. Negative for any acute cardiopulmonary process.  ASSESSMENT: 1. Chronic pancytopenia, severe.  2. Macrocytic anemia with possible iron deficiency.  3. Elevated liver function tests of unclear etiology.  4. Morbid obesity.  5. Diarrhea postprandially.   PLAN: I have  discussed this patient's case in detail with Dr. Arther Dames who is involved in the development of the patient's plan of care. At this time, we do agree with checking stool studies to include C. difficile culture, white blood cells and ova and parasites. These results have been collected but are still pending and we will await these findings. We do agree with administering IV fluids and continuing the patient on a clear liquid diet. Blood cultures were also obtained and we are awaiting the final report as well. Of note, HIV was checked and was negative. Blood culture has no growth to date. Iron studies have been placed and we are certainly curious to see if this is truly an iron deficiency anemia and can help further investigate his pancytopenia as well. We do appreciate hematology consult as well, and does appear that they would like to perform a bone marrow biopsy in the upcoming days. We would also like to check an abdominal ultrasound with Doppler to evaluate his elevated LFTs and other symptomatology. This is expressed to the patient. He verbalized understanding and all questions were answered. We will continue to monitor this patient throughout hospitalization and make further recommendations pending above and per clinical course.   Thank you so much for this consultation and for allowing [place] to participate in the patient's plan of care.   These services were provided by Loren Racer, PA-C, under collaborative agreement with Arther Dames, MD. ____________________________ Corky Sox. Edoardo Laforte, PA-C kme:sg D: 07/15/2013 15:35:00 ET T: 07/15/2013 15:51:43 ET JOB#: [id number]  cc: Corky Sox. Anab Vivar, PA-C, <Dictator> Boyce PA ELECTRONICALLY SIGNED 07/16/2013 11:35

## 2014-12-25 NOTE — Consult Note (Signed)
Although patient's ferritin is significantly elevated, it is likely secondary to an acute phase reactant.  His iron saturation and iron stores are decreased therefore will give 510 mg IV Feraheme today.  Patient will still require bone marrow biopsy in the next several days. follow.  Electronic Signatures: Delight Hoh (MD)  (Signed on 12-Nov-14 08:57)  Authored  Last Updated: 12-Nov-14 08:57 by Delight Hoh (MD)

## 2014-12-25 NOTE — Consult Note (Signed)
Brief Consult Note: Diagnosis: diarrhea, pancytopenia.   Patient was seen by consultant.   Consult note dictated.   Discussed with Attending MD.   Comments: Patient seen and examined. He presents with pancyopenia, and accordiing to the patient this is chronic. He has also been having diarrhea for the past 4-5 days. There is associated urgency and he has needed to move quickly when he feels the urge. He unfortunately cannot tell me whether or not he has seen any blood or abnormal color. He cannot tell me if he has used the bathroom yet today and if so how many times. Per reports. it seems stool has been "cream" colored. Hgb low, was 10.5, now 9.6. MCV is microcytic. Bilirubin elevated at 2.1 with AST 182.  Pt has never had a colonoscopy. no history of liver dz. Stool culture, cdiff, o&p, wbc pending. Already collected.  blood culture pending as well. HIV negative.  He denies any nausea, vomiting, abdominal pain. he has been running a low-grade fever, about 11, which patient states is normal for him "when hes sick". continue CL diet. awaiting stool and blood cultures. continue IV fluids. Consider looking into elevated bilirubin and AST further. recheck hepatic panel.  full consult being dictated. will follow.  Electronic Signatures: Ashok Cordiaarle, Chenae Brager M (PA-C)  (Signed 11-Nov-14 14:01)  Authored: Brief Consult Note   Last Updated: 11-Nov-14 14:01 by Ashok CordiaEarle, Gearlene Godsil M (PA-C)

## 2014-12-25 NOTE — Consult Note (Signed)
History of Present Illness:  Reason for Consult Pancytopenia.   HPI   Patient is a 44 year old male has not been evaluated since his last hospital admission in January 2013.  At that point patient was found to be significantly pancytopenic,  but full workup did not reveal a distinct etiology.  He did not have a bone marrow biopsy at that time.  He has recently limited to the hospital for ongoing diarrhea.  He otherwise has felt well.  He denies any recent fevers.  He has no neurologic complaints.  He denies any chest pain or shortness of breath.  He has a fair appetite, but denies weight loss.  He denies any nausea, vomiting, or constipation.  He denies any easy bleeding or bruising.  Patient offers no further specific complaints today.  PFSH:  Additional Past Medical and Surgical History Juvenile rheumatoid arthritis, pancytopenia, meningeal encephalitis.  Tracheostomy, cholecystectomy.  Social history: Patient denies tobacco or alcohol.  Family history: Negative and noncontributory.   Review of Systems:  Performance Status (ECOG) 2   Review of Systems   As per HPI. Otherwise, 10 point system review was negative.  NURSING NOTES: **Vital Signs.:   10-Nov-14 21:23   Vital Signs Type: Routine   Temperature Temperature (F): 100.4   Celsius: 38   Temperature Source: oral   Pulse Pulse: 109   Respirations Respirations: 20   Systolic BP Systolic BP: 973   Diastolic BP (mmHg) Diastolic BP (mmHg): 82   Mean BP: 97   Pulse Ox % Pulse Ox %: 96   Pulse Ox Activity Level: At rest   Oxygen Delivery: Room Air/ 21 %   Physical Exam:  Physical Exam General: Obese, no acute distress. Eyes: Pink conjunctiva, anicteric sclera. Lungs: clear to auscultation bilaterally Heart: tachycardic. Abdomen: Soft, obese, normoactive bowel sounds. Musculoskeletal: No edema, cyanosis, or clubbing. Neuro: Alert, answering all questions appropriately. Cranial nerves grossly intact. Skin: No  rashes or petechiae noted. Psych: normal affect    Tylenol: Other  Laboratory Results: Thyroid:  11-Nov-14 06:55   Thyroxine, Free 1.43 (Result(s) reported on 15 Jul 2013 at 11:55AM.)  Thyroid Stimulating Hormone  7.24 (0.45-4.50 (International Unit)  ----------------------- Pregnant patients have  different reference  ranges for TSH:  - - - - - - - - - -  Pregnant, first trimetser:  0.36 - 2.50 uIU/mL)  Routine Chem:  11-Nov-14 06:55   Magnesium, Serum  1.7 (1.8-2.4 THERAPEUTIC RANGE: 4-7 mg/dL TOXIC: > 10 mg/dL  -----------------------)  Folic Acid, Serum 53.2 (Result(s) reported on 15 Jul 2013 at 09:47AM.)  Result Comment PLATELET/WBC - RESULTS VERIFIED BY REPEAT TESTING.  - CRITICAL VALUE PREVIOUSLY NOTIFIED.  Result(s) reported on 15 Jul 2013 at 07:19AM.  Glucose, Serum  116  BUN 14  Creatinine (comp) 1.29  Sodium, Serum  130  Potassium, Serum  3.0  Chloride, Serum  96  CO2, Serum 25  Calcium (Total), Serum  7.8  Anion Gap 9  Osmolality (calc) 262  eGFR (African American) >60  eGFR (Non-African American) >60 (eGFR values <83mL/min/1.73 m2 may be an indication of chronic kidney disease (CKD). Calculated eGFR is useful in patients with stable renal function. The eGFR calculation will not be reliable in acutely ill patients when serum creatinine is changing rapidly. It is not useful in  patients on dialysis. The eGFR calculation may not be applicable to patients at the low and high extremes of body sizes, pregnant women, and vegetarians.)  Routine Sero:  11-Nov-14 06:55   Rapid  HIV 1/2 Ab Test with Confirmation ([place]) NEG - HIV 1/2 AB This is a screening test for the presence of HIV-1/2 antibodies. False-negative and false-positive results can occur. All preliminary positive samples are sent for confirmatory testing. Clinical correlation is necessary to assess whether repeat testing may be needed for negative results.  Routine Hem:  11-Nov-14 06:55   WBC  (CBC)  1.8  RBC (CBC)  3.48  Hemoglobin (CBC)  9.6  Hematocrit (CBC)  27.4  Platelet Count (CBC)  18  MCV  79  MCH 27.5  MCHC 34.9  RDW  17.2  Neutrophil % 86.5  Lymphocyte % 5.4  Monocyte % 7.4  Eosinophil % 0.4  Basophil % 0.3  Neutrophil # 1.6  Lymphocyte #  0.1  Monocyte #  0.1  Eosinophil # 0.0  Basophil # 0.0   Assessment and Plan: Impression:   Pancytopenia. Plan:   1.  Pancytopenia: Continues to be decreased, but essentially unchanged.   Full workup in January 2013 did not reveal a distinct etiology.  Although patient was noted to be iron deficient and received IV iron during that admission.  Will recheck iron stores for completeness.  Patient most likely will need a bone marrow biopsy, but this may be difficult given his body habitus.  Will attempt in the next 1-2 days, but if not possible may need to be coordinated as an outpatient in special procedures. consult, will follow.  Electronic Signatures: Delight Hoh (MD)  (Signed 11-Nov-14 13:09)  Authored: HISTORY OF PRESENT ILLNESS, PFSH, ROS, NURSING NOTES, PE, ALLERGIES, LABS, ASSESSMENT AND PLAN   Last Updated: 11-Nov-14 13:09 by Delight Hoh (MD)

## 2014-12-25 NOTE — Discharge Summary (Signed)
PATIENT NAME:  Robert Steele, Robert Steele MR#:  [id number] DATE OF BIRTH:  23-Sep-1970  DATE OF ADMISSION:  07/14/2013 DATE OF DISCHARGE:  07/17/2013  DISCHARGE DIAGNOSES:  1. Viral gastroenteritis.  2. Chronic pancytopenia with worsening thrombocytopenia.  3. Splenomegaly.  4. Hyponatremia.  5. Hypokalemia.   CONSULTS:  1. Dr. Grayland Ormond with hematology.  2. Dr. Rayann Heman with GI.   IMAGING STUDIES DONE: Include an ultrasound of the abdomen showed liver mildly enlarged and splenomegaly. No acute abnormalities found.   ADMITTING HISTORY AND PHYSICAL AND HOSPITAL COURSE: Please see detailed H and P dictated by Dr. Verdell Carmine. In brief, a 44 year old male patient with history of pancytopenia, juvenile rheumatoid arthritis, presented to the hospital complaining of acute diarrhea. The patient was found to be febrile, pancytopenic. Admitted to the hospitalist service. The patient had stool checked for C. diff which was negative, stool cultures which were negative. The patient did have fever at the time of admission without any antibiotics. He has improved well. GI saw the patient who felt the patient had viral gastroenteritis which I agree with. The patient had cultures drawn which were negative. The patient, for his pancytopenia and worsening thrombocytopenia, had a bone marrow biopsy done. Platelets are improving at this time. His worsening of pancytopenia was likely secondary from acute infection which is resolving. Bone marrow biopsies are awaited, and the patient will follow up with Dr. Grayland Ormond in a week as an outpatient.   Prior to discharge, the patient's abdominal examination showed no tenderness. Bowel sounds were present. PT worked with the patient and suggested the patient have home health with PT which has been set up.   DISCHARGE MEDICATIONS: None.   DISCHARGE INSTRUCTIONS: Home health with physical therapy set up. Continue physical therapy at home. Regular diet. Activity as tolerated. Follow up with Dr.  Grayland Ormond in 1 to 2 weeks regarding bone marrow biopsy. The patient needs to watch out for any bleeding. Return to the Emergency Room if this reoccurs.    TIME SPENT ON DAY OF DISCHARGE IN DISCHARGE ACTIVITY: 40 minutes.    ____________________________ Leia Alf Ziyon Cedotal, MD srs:gb D: 07/21/2013 01:44:25 ET T: 07/21/2013 02:56:53 ET JOB#: [id number]  cc: Alveta Heimlich R. Keasia Dubose, MD, <Dictator> Neita Carp MD ELECTRONICALLY SIGNED 07/21/2013 20:48

## 2014-12-27 NOTE — Consult Note (Signed)
Brief Consult Note: Consult note dictated.   Comments: 1. spleenomegaly, pancytopenia,fever, uncertain duration(denies lab since 1999) 2. prior menigitis with neuro deficit. weakness, clonus 3. 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 ?question prior pancreatitis on US 5 .abnl liver  6. back rash, ?heat     rec:   consider PET scan, bone marrow if infectious w/up neg, neuro eval of his clonus and LE weakness.  Electronic Signatures: Royann ShiversKernodle, Jr., Helen HashimotoGeorge Wallace (MD)  (Signed 24-Jan-13 14:12)  Authored: Brief Consult Note   Last Updated: 24-Jan-13 14:12 by Royann ShiversKernodle, Jr., Helen HashimotoGeorge Wallace (MD)

## 2014-12-27 NOTE — Consult Note (Signed)
PATIENT NAME:  Robert Steele, Robert Steele MR#:  [id number] DATE OF BIRTH:  [birth year]  DATE OF CONSULTATION:  09/28/2011  REFERRING PHYSICIAN:  Krystal EatonShayiq Ahmadzia, MD CONSULTING PHYSICIAN:  Kandyce RudGeorge W. Kernodle Jr., MD  HISTORY OF PRESENT ILLNESS: This is a 44 year old white male, history from patient. He said as a child, [place] he had an illness at about age 89 he said episodically of swelling in his ankles, but had fever and was diagnosed as "a rare form of JRA". He said he had some flare of that as a child. No definite sequela from that, by his report, with no joint problems as an adult. In 1999 he had meningitis and was left with some weakness in his legs; no disabilities. He has been able to ambulate some at home, but with some difficulty. By his report, he has not seen a physician since 1999. He has not had any laboratories drawn. Recently he had decreased ability to ambulate, but also fever. He came to the emergency room where he was found to be pancytopenic and was admitted. Work-up has showed a CT scan with splenomegaly as well as an abnormal liver, with fatty liver, and ultrasound abnormality of prior pancreatitis. White count recently was 1900, hemoglobin up to 9.8, and platelets 50,000. Epstein-Barr virus titers showed positive EA antigen. Rheumatoid factor 14 and HIV negative. Vitamin D 14, CRP 74, and sedimentation rate 76. Ferritin was 694, but iron saturation was 7%. He has had low grade fever and has been on some Tylenol. He says his legs are weak, but physical therapy has helped that. He says he is unable to squat. He has not had particular headache, no visual changes.   PAST MEDICAL HISTORY:  1. Questionable JRA. 2. Prior meningitis.  3. Obesity.  PAST SURGICAL HISTORY: Cholecystectomy.   SOCIAL HISTORY: No cigarettes or alcohol.   FAMILY HISTORY: Negative for rheumatic diseases.   REVIEW OF SYSTEMS: No headache, chest pain, abdominal pain, or diarrhea. Joints have not  bothered him in his upper extremities. No significant back pain or neck pain.    PHYSICAL EXAMINATION:   GENERAL: Pleasant male no acute distress.   VITAL SIGNS: Temperature 100, pulse 118, respiratory rate 19, and blood pressure 129/80.   SKIN: There is a papular rash over his back, but not his chest or legs.   HEENT: Sclerae clear. Clear oropharynx.   NECK: Supple. No thyromegaly.   CHEST: Clear.   HEART: No murmur. No definite visceromegaly.   ABDOMEN: Large abdomen, nontender.   EXTREMITIES: Trace edema.   MUSCULOSKELETAL: Good range of motion of neck and shoulders. Hands without synovitis. Hips move well. Knees without effusion. Ankles with some mild cockup toes on the left, but reasonable range of motion of the ankles.   NEUROLOGIC: He has 2+ biceps and triceps reflexes in his upper extremities. He has bilaterally ankle clonus. He has left knee clonus, 2+ right knee.   UPPER EXTREMITIES: Strength is 5/5 in biceps and triceps. I stood him at the side of the bed. He has an antalgic gait and cannot squat. Formal hip flexor testing is 5-/5.  Formal dorsi and plantar flexion testing is about 5-/5.   IMPRESSION:  1. Splenomegaly, pancytopenia and fever of uncertain duration. He denies any labs since 1999 so it is hard to know whether this is a new or old process. We cannot rule out infection or hematologic disease or malignancy.  2. Prior meningitis with neurologic deficit. Now with weakness and  clonus, and perhaps worse.  3. Illness at age 17, thought to be "rare form of JRA".  It could have been systemic JRA. We do not have records to confirm that. No evidence of adult rheumatoid arthritis. 4. Abnormal liver function and abnormal liver ultrasound.  5. Rash on his back, possibly heat rash.   RECOMMENDATIONS:  1. I agree with Infectious Disease work-up. If negative, we might consider PET scan and bone marrow given lack of history to substantiate the chronicity of his pancytopenia.   2. Consider neurologic evaluation of his clonus and lower extremity weakness and may help determine his disposition as well as rehab for the future.  ____________________________ Kandyce Rud., MD gwk:slb D: 09/28/2011 14:19:23 ET T: 09/28/2011 15:39:52 ET JOB#: [id number]  cc: Kandyce Rud., MD, <Dictator> Rosalyn Gess. Blocker, MD Webb Silversmith MD ELECTRONICALLY SIGNED 09/29/2011 12:17

## 2014-12-27 NOTE — Consult Note (Signed)
History of Present Illness:   Reason for Consult Pancytopenia.    HPI Patient is a 28 male with a long-standing history of juvenile rheumatoid arthritis.  He also reports a long-standing history of pancytopenia.  Patient admits he has not had bloodwork or been evaluated by a physician in "many years".   He also reports multiple bone marrow biopsies, but does not recall the results.  He initially presented to the emergency room with increasing weakness in his lower extremities, fevers, or rigors.  Despite the fact that he is actively wheezing, he denies any chest pain or shortness of breath.  He has no neurologic complaints.  He denies any recent illnesses.  He has good appetite and denies any weight loss.  He denies any nausea, vomiting, constipation, or diarrhea.  He has no melena or hematochezia.  He has no urinary complaints.  Patient feels generally terrible, but offers no further specific complaints.   PFSH:   Additional Past Medical and Surgical History Past medical history:  Juvenile rheumatoid arthritis, pancytopenia, meningeal encephalitis.  Past surgical history:  Tracheostomy, cholecystectomy.  Social history: Patient denies tobacco or alcohol.  Family history: Negative and noncontributory.   Review of Systems:   Performance Status (ECOG) 1    Review of Systems As per HPI. Otherwise, 10 point system review was negative.   NURSING NOTES: **Vital Signs.:   21-Jan-13 15:18    Vital Signs Type: Routine    Temperature Temperature (F): 98.6    Celsius: 37    Temperature Source: oral    Pulse Pulse: 126    Pulse source: per Dinamap    Respirations Respirations: 20    Systolic BP Systolic BP: 741    Diastolic BP (mmHg) Diastolic BP (mmHg): 58    Mean BP: 73    BP Source: Dinamap    Pulse Ox % Pulse Ox %: 95    Pulse Ox Activity Level: At rest    Oxygen Delivery: Room Air/ 21 %   Physical Exam:   Physical Exam General: Obese, no acute distress. Eyes: Pink  conjunctiva, anicteric sclera. HEENT: Normocephalic, moist mucous membranes, clear oropharnyx. Lungs: Wheezing throughout. Heart: Regular rate and rhythm. No rubs, murmurs, or gallops. Abdomen: Soft, nontender, nondistended. No organomegaly noted, normoactive bowel sounds. Musculoskeletal: No edema, cyanosis, or clubbing. Neuro: Alert, answering all questions appropriately. Cranial nerves grossly intact. Skin: No rashes or petechiae noted. Psych: Anxious.    No Known Allergies:   Routine Chem:  21-Jan-13 07:06    Glucose, Serum 96   BUN 10   Creatinine (comp) 0.87   Sodium, Serum 136   Potassium, Serum 3.1   Chloride, Serum 102   CO2, Serum 24   Calcium (Total), Serum 8.0   Osmolality (calc) 271   eGFR (African American) >60   eGFR (Non-African American) >60   Anion Gap 10   Magnesium, Serum 1.7   Assessment and Plan:   Impression Pancytopenia.    Plan 1.  Pancytopenia: Most likely related to his underlying rheumatoid arthritis and significantly elevated C-reactive protein causing some bone marrow suppression.  He had an abdominal ultrasound, but there was no mention of splenomegaly. Patient was also noted to be iron deficient and received 510 mg IV Feraheme today.  The remainder of his laboratory work was within normal limits.  For completeness, hemolysis labs, anti-neutrophil antibodies, and antiplatelet antibodies have been ordered.  A  bone marrow biopsy is not necessary at this time and given patient's body habitus would be technically  very difficult.  Appreciate consult, will follow.   Electronic Signatures: Delight Hoh (MD)  (Signed 21-Jan-13 15:37)  Authored: HISTORY OF PRESENT ILLNESS, PFSH, ROS, NURSING NOTES, PE, ALLERGIES, LABS, ASSESSMENT AND PLAN   Last Updated: 21-Jan-13 15:37 by Delight Hoh (MD)

## 2014-12-27 NOTE — Consult Note (Signed)
PATIENT NAME:  Robert Steele, Robert Steele MR#:  [id number] DATE OF BIRTH:  July 30, 1971  DATE OF CONSULTATION:  09/28/2011  REFERRING PHYSICIAN:  Vivien Presto, MD   CONSULTING PHYSICIAN:  Heinz Knuckles. Jury Caserta, MD  REASON FOR CONSULTATION: Fever and leukopenia.   HISTORY OF PRESENT ILLNESS: The patient is a 44 year old white man with a past history significant for juvenile rheumatoid arthritis, pancytopenia, and meningoencephalitis who was admitted on January 19th with fever and leukopenia. The patient is an extremely poor historian, and the history that he gave me of his symptoms leading up to his hospitalization were different than what were noted in the History and Physical. He describes to me that he has episodic times where his lower extremity weakness worsens and he has trouble getting around. He states that he will have these flares occasionally. He has not, however, been needing medical care in over 10 years. The History and Physical indicates that he was having fevers, but he denied this to me. It also indicates that he is having rigors. He denies any red hot swollen joints. He states that he had juvenile rheumatoid arthritis as a child, and he has not had significant problems or treatment since childhood. 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 He states that he was in the hospital for multiple weeks and upon leaving had his lower extremity weakness. He states that he has had pancytopenia but has not been tested recently, and there is no old record to review any prior work-up. On admission to the hospital, he was febrile in the ER to 101.5. Since being in the hospital, he has spiked as high as 102.3. Cultures from admission show no growth, and he has not been placed on antibiotics. His white count on admission was 2.7, and it has come down as low as 1.4. His ANC has been as low as 0.8. Currently he states he is feeling fairly well, although he still  has the lower extremity weakness. He denied any fever despite having elevated temperature within the last 24 hours. He denies any sinus congestion, sore throat, cough, shortness of breath, gastrointestinal symptoms, genitourinary symptoms, arthritic symptoms, or rashes.   ALLERGIES: None.   PAST MEDICAL HISTORY:  1. Juvenile rheumatoid arthritis. He states that he had a very rare version of this, and he has not had significant problems since childhood. He is not on any medications for rheumatoid arthritis.  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 He describes being in a coma and being hospitalized for over five weeks. He states that he has had recurrent episodes of cyclical weakness since then that he typically just waits out.  3. Pancytopenia. He has not had blood work in many years. He states that this has been worked up in the past, but he does not give any specific diagnosis.   SOCIAL HISTORY: The patient lives by himself. He does not have any pets. He does not drink. He does not smoke. No history of injecting drug use.   FAMILY HISTORY: The patient could not recall any family history.    REVIEW OF SYSTEMS: GENERAL: Positive for generalized malaise, fatigue, and lower extremity weakness. He denies any fevers, chills, or sweats to me; although the History and Physical indicates that he was complaining of fevers and rigors, and his hospital course has indicated episodes of moderate fever. HEENT: No headaches, no sinus congestion, no sore throat. NECK: No stiffness. No swollen glands. RESPIRATORY: No cough,  no shortness of breath. No sputum production. CARDIAC: No chest pains or palpitations. GI: No nausea, no vomiting. He says he has had some intermittent diarrhea on occasion; however, Nursing states that he has had explosive diarrhea over the last day, and they have sent off a C. difficile PCR that is currently pending. He has had no blood in his stool. GENITOURINARY: No complaints.  MUSCULOSKELETAL: No swollen joints. No erythematous joints. NEUROLOGIC: He has had increasing weakness in his lower extremities but no frank paralysis. His upper extremities have been within normal limits for him. PSYCHIATRIC: No complaints. All other systems are negative.   PHYSICAL EXAMINATION:  VITAL SIGNS: T-max of 102.3, T-current of 98.0, pulse 116, blood pressure 121/69, 93% on room air.   GENERAL: An obese 44 year old white man in no acute distress.   HEENT: Normocephalic, atraumatic. Pupils are equal and reactive to light. Extraocular motion intact. Sclerae, conjunctivae, and lids are without evidence for emboli or petechiae. Oropharynx shows no erythema or exudate. Teeth and gums are in fair condition.   NECK: Supple. Full range of motion. Midline trachea. No lymphadenopathy. No thyromegaly.   LUNGS: Clear to auscultation bilaterally with good air movement. No focal consolidation.   HEART: Regular rate and rhythm without murmur, rub, or gallop.   ABDOMEN: Soft, nontender, and nondistended. No hepatosplenomegaly. No hernia is noted.   EXTREMITIES: No evidence for tenosynovitis.   SKIN: No rashes. No stigmata of endocarditis, specifically no Janeway lesions nor Osler nodes.   NEUROLOGIC: The patient is moving all four extremities. His strength appeared to be fairly intact. His mental capacity appeared to be somewhat slow. He had difficulty pinpointing dates and timeframes of his medical history. He also had trouble appearing to understand the significance of his current illness.   PSYCHIATRIC: Mood and affect appeared normal.   LABORATORY, DIAGNOSTIC AND RADIOLOGICAL DATA:  BUN 7, creatinine 0.89, bicarbonate 27, anion gap 10, AST of 92, ALT 36, alkaline phosphatase 128, total bilirubin of 0.8.  White count 1.9 with a hemoglobin of 9.8, platelet count of 50, ANC of 1.4. His white count on admission was 2.7. A sedimentation rate from admission was 76.  Blood cultures from admission  show no growth. Rapid influenza testing was negative.  A urinalysis from admission was unremarkable.  Urine culture was negative.  Blood cultures from January 22nd are negative.  A urinalysis from January 24th is unremarkable.  CMV IgM is negative. Parvovirus B19 IgM and IgG were negative. Epstein-Barr virus serology showed a negative Epstein-Barr virus VCA IgM,  positive Epstein-Barr virus early antigen IgG, negative Epstein-Barr virus VCA IgG, positive Epstein-Barr virus nuclear antigen IgG.  Hepatitis A, B, and C serologies were all negative.  RA titer was 14.4.  Vitamin B12 was 801.  ANA was negative.  C-reactive protein was 74.9.  A rapid HIV test was negative.  Chest x-ray from admission shows no acute abnormality.  Abdominal ultrasound showed a fatty liver and echogenic pancreas suggesting chronic pancreatitis.   IMPRESSION: A 44 year old white man with a history of juvenile rheumatoid arthritis, pancytopenia, and meningoencephalitis admitted with fever and leukopenia.   RECOMMENDATIONS:  1. He has been having modest fever since admission. Blood cultures, urinalysis, and chest x-ray been unrevealing. He has no localizing symptoms. He has not been placed on antibiotics.  2. He is a very poor historian. No old records are available. His history of meningoencephalitis with coma and prolonged hospitalization could have left him with some lower extremity weakness that could flare at  times. I could not easily explain his low blood counts, however. As he reports that this is chronic, it is unlikely related to an acute viral infection. Chronic viruses such as HIV, hepatitis B, and hepatitis C have been excluded. Parvovirus, Epstein-Barr virus or CMV would be associated with more acute or subacute bone marrow suppression and would not last for years. His Epstein-Barr virus serologies indicate prior infection but not recent infection.  3. Rheumatology has seen him. He does not appear to have any  acute rheumatic flare.  4. Hematology does not feel bone marrow biopsy is indicated at this time.  5. I would follow his fever curve and white count. If he becomes neutropenic (ANC less than 0.6), I would start ceftazidime empirically and place him on neutropenic precautions.  6. We will await his most recent cultures.  7. If he continues to spike, I would get a CT scan of the chest, abdomen and pelvis.   This is a highly complex Infectious Disease case.   Thank you very much for involving me in Robert Steele's care.   ____________________________ Heinz Knuckles. Cornelio Parkerson, MD meb:cbb D: 09/28/2011 15:43:29 ET T: 09/28/2011 16:56:40 ET JOB#: [id number]  cc: Heinz Knuckles. Merryn Thaker, MD, <Dictator> Braylon Grenda E Evey Mcmahan MD ELECTRONICALLY SIGNED 10/16/2011 13:19

## 2014-12-27 NOTE — Consult Note (Signed)
Impression: 44yo WM w/ h/o JRA, pancytopenia and meningoencephalitis admitted with fever and leukopenia.  He is having modest fevers since admission.  BCx, u/a and CXR have been unrevealing.  He has no localizing symptoms.  He has not been placed on antibiotics. He is a very poor historian.  No old records are available.  His history of meningoencephalitis with coma and prolonged hospitalization could have left him with some LE weakness that could flair at times.  I cannot easily explain his low blood counts, however.  As he reports that this is chronic, it is unlikely to be related to an acute viral infection.  Chronic viruses such as HIV, hep B and hep C have been excluded.  Parvovirus, EBV or CMV would be associated with acute or subacute bone marrow suppression and would not last for years. Rheumatology has seen him.  He does not appear to have any acute rheumatoid flair. Hematology does not feel BM Bx is indicated at this time. Would follow his fever curve and CBC.  If he becomes neutropenic (ANC <0.6) would start ceftazidime empirically. Will await his most recent cultures. If he continues to spike would get CT of the chest, abd and pelvis.  Electronic Signatures: Bela Nyborg, Rosalyn GessMichael E (MD)  (Signed on 24-Jan-13 15:22)  Authored  Last Updated: 24-Jan-13 15:22 by Bradly Sangiovanni, Rosalyn GessMichael E (MD)

## 2014-12-27 NOTE — H&P (Signed)
PATIENT NAME:  Robert Steele, Robert Steele MR#:  [id number] DATE OF BIRTH:  [birth year]  DATE OF ADMISSION:  09/23/2011  REFERRING PHYSICIAN: Dr. Lenise Arena PRIMARY CARE PHYSICIAN: None.   CHIEF COMPLAINT: Weakness, fevers.   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 Patient states that at baseline he has decreased range of motion and weakness in his lower extremity joints. He has flares probably once about a year with increased weakness and range of motion difficulties where he "sits it out" and takes care of himself. He has no physician follow up for this. Patient states that he was told that there is no cure for this. He states that this is a rare form of juvenile arthritis and he is not on any current medications. Patient states for the past several days he has been having fevers with shakes and rigors. There are no sick contacts. There is some shortness of breath, however, there is no productive cough or sputum production. There is no urinary symptoms such as frequency, incontinence or dysuria, or GI symptoms like diarrhea, abdominal pain. On arrival patient was febrile to 101.5 and tachycardic. Hospitalist services were contacted for further evaluation and management.   PAST MEDICAL HISTORY:  1. Juvenile rheumatoid arthritis since age 44.  2. History of pancytopenia. (Patient states that he has history of low white blood cell count, red blood cell count and platelets, however, he has had no rheumatological follow-up for this and does not know what his current counts are as he has had no blood draws for multiple years).. 3. 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   PAST SURGICAL HISTORY:  1. History of trach. 2. History of  right-sided thoracentesis and pleural effusion drainage.  3. History of cholecystectomy.   MEDICATIONS: None.   ALLERGIES: Denies.   SOCIAL HISTORY: Denies alcohol, tobacco or drug use.   FAMILY HISTORY: Denies any family history of arthritis, diabetes or hypertension.  REVIEW OF SYSTEMS: CONSTITUTIONAL: Fatigue and weakness as stated above. No weight changes. EYES: No blurry vision or double vision. ENT: No tinnitus or hearing loss. RESPIRATORY: No cough or wheezing. There is some chronic dyspnea on exertion and some shortness of breath. No painful respiration. No history of tuberculosis or pneumonia. CARDIOVASCULAR: No chest pain, orthopnea, or edema. GASTROINTESTINAL: No nausea, vomiting, or diarrhea. No abdominal pain. No dark stools. No melena. GENITOURINARY: No dysuria, hematuria, or frequency. ENDOCRINE: No polyuria, nocturia, or thyroid problems. HEME/LYMPH: History of pancytopenia as above. No bleeding or swollen glands. SKIN: No rashes. MUSCULOSKELETAL: History of juvenile rheumatoid arthritis and chronic pain and decreased range of motion in his lower extremities more so than upper extremities, however, no current swelling, redness or pain in the joints involved. NEUROLOGICAL: No history of vertigo, cerebrovascular accident or transient ischemic attack. Patient does have history of meningeal encephalitis. PSYCH: No anxiety or insomnia.   PHYSICAL EXAMINATION:  VITAL SIGNS: Temperature on arrival 101.5, pulse rate 84, respiratory rate 20, blood pressure 119/71, initial pulse was 126, oxygen saturation 95% on room air.   GENERAL: Patient is well developed, well built Caucasian male lying in bed in no obvious distress.  HEENT: Normocephalic, atraumatic. Pupils are equal and reactive. Anicteric sclerae. Moist mucous membranes. No cervical lymphadenopathy.   NECK: Supple. No thyroid tenderness.  CARDIOVASCULAR: S1, S2 regular rate and rhythm. No murmurs, rubs, or gallops.   LUNGS:  Clear to auscultation without wheezing or rhonchi. There are healed oblique right-sided posterior healed scar. No area of drainage or evidence for cellulitis.   ABDOMEN: Soft, nontender, nondistended. Positive hyperactive bowel sounds. No rebound or guarding.   EXTREMITIES: No significant edema.   SKIN: No obvious rashes.   JOINTS: Joints above the bilateral knees, small joints of the hands and elbows no significant tenderness, effusions, erythema or edema. Decreased range of motion bilateral knees.   NEUROLOGIC: Cranial nerves II through XII grossly intact. Strength 5/5 in all extremities.   LABORATORY, DIAGNOSTIC AND RADIOLOGICAL DATA: Glucose 140, BUN 10, creatinine 1.05, sodium 137, potassium 2.7, albumin 3, total bilirubin 1.5, alkaline phosphatase 90, AST 61, ALT 29, WBC 2.7, hemoglobin 9.9, hematocrit 29.6, platelets 87. Rapid flu negative. Urinalysis no nitrates or leukocyte esterase, 2 WBCs, trace bacteria. Lactic acid 1.1. X-ray of the chest: No acute abnormality in one view study.   ASSESSMENT AND PLAN: We have a 44 year old Caucasian male with history of juvenile rheumatoid arthritis, history of pancytopenia and meningeal encephalitis who presents with fevers and systemic inflammatory response syndrome criteria with hypokalemia and a pancytopenia. Patient will be admitted to the hospital. Patient does appear to have SIRS criteria with fever and tachycardia on arrival, however, looks nontoxic. So for studies have not shown a possible causes including x-rays of the chest, rapid flu and the urinalysis. We would follow with the blood and urine cultures. I would hold on antibiotics for now. It is unclear if this is infectious versus inflammatory in nature. Patient believes that this is a case of flare up for his rheumatoid arthritis although this is yet to be seen. We would check ESR, CRP and consult with rheumatology. For his hypokalemia, this would be repleted with p.o. and IV and will check  levels in the morning. The patient does have pancytopenia, however, he states that he was told this before. There is no previous blood work in the computer, however. We would monitor the counts. There is no recent blood work per patient and his last blood work was likely about a decade ago. I would check iron studies and stool guaiac. He does appear to have elevated LFTs, however, there is no abdominal tenderness. I would check a right upper quadrant ultrasound and trend these in the morning. Would also consult with physical therapy. Patient needs to follow up with rheumatology post discharge for his arthritis and needs to obtain a primary care physician as he is without one currently.   TOTAL TIME SPENT: 50 minutes.   CODE STATUS: Patient is FULL CODE.  ____________________________ Vivien Presto, MD sa:cms D: 09/23/2011 17:30:10 ET T: 09/24/2011 08:09:27 ET JOB#: [id number]  cc: Vivien Presto, MD, <Dictator> Vivien Presto MD ELECTRONICALLY SIGNED 10/03/2011 18:41

## 2014-12-27 NOTE — Discharge Summary (Signed)
PATIENT NAME:  Robert Steele, Robert Steele MR#:  [id number] DATE OF BIRTH:  1971/04/28  DATE OF ADMISSION:  09/23/2011 DATE OF DISCHARGE:  10/02/2011  Please see interim discharge summary dictated by Dr. Dustin Flock on 09/28/2011. Also see history and physical dictated by Dr. Bridgette Habermann on 09/23/2011 for further details.    DISCHARGE DIAGNOSES: Remained the same as dictated by Dr. Dustin Flock in the last interim discharge summary.   1. 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  2. Fever, initially thought to have possible SIRS, however, his urine cultures, blood cultures are negative. He has been followed by ID. No clear source of his fevers are noted. can use antipyretics as needed. 3. Hypokalemia likely due to poor p.o. intake as well as GI loss with diarrhea status post replacement.  4. Pancytopenia with worsening leukopenia, worsening hemoglobin and hematocrit, and thrombocytopenia status post transfusion. Has been followed by Hematology. Possibly related to splenomegaly.  5. Elevated sed rate with history of stated rare form of juvenile rheumatoid arthritis. His Rheumatoid factor was negative. His ANA panel was negative. Rheumatology evaluation has been ordered.  6. Elevated liver function tests. Ultrasound findings consistent with fatty liver, likely cause for his LFTs. Hepatitis panel is negative.  7. Sinus tachycardia. Echocardiogram was done which was nonrevealing. CT per PE protocol was negative. Could be due to fever. 8. Hyperglycemia, felt to be reactive with a hemoglobin A1c of 5.0.  9. History of juvenile rheumatoid arthritis with no active indication of acute inflammation.  10. Morbid obesity.  11. Vitamin D deficiency.  CONSULTATIONS: No new consultations were obtained since last interim discharge summary dictated by Dr. Posey Pronto.    LABORATORY, DIAGNOSTIC AND RADIOLOGICAL DATA: None since last interim discharge summary dictated.    HISTORY AND SHORT HOSPITAL COURSE: Please see Dr. Chana Bode Patel's dictated interim discharge summary for further details. Essentially patient was waiting to get his blood count stabilized as he remained pancytopenic. His blood counts were slowly improving. Was followed by oncology and infectious disease throughout the course of the hospital. He did have low-grade fever with a T-max up to 100.4 which was not thought to be any definite infectious etiology as per Dr. Clayborn Bigness and recommended bone marrow biopsy. Per Dr. Grayland Ormond the bone marrow biopsy would be difficult considering his body habitus and will be performed as an outpatient with CT guidance. Patient is feeling much better, has remained somewhat weak and would benefit from physical therapy and has accepted bed at [facility] where he is being discharged today in stable condition.   PHYSICAL EXAMINATION: VITAL SIGNS: On the date of discharge his vital signs are as follows: Temperature 98.3, heart rate 100 per minute, respirations 18 per minute, blood pressure 117/66 mmHg. He is saturating 93% on room air. Pertinent Physical Examination: CARDIOVASCULAR: S1, S2 normal. Difficult auscultation due to distal body habitus. LUNGS: Decreased breath sounds at the bases. No wheezing, rales, rhonchi, crepitation. ABDOMEN: Morbidly obese. Soft, nontender. Difficult evaluation secondary to morbid obesity.  NEUROLOGIC: Nonfocal examination. All other physical examination remained at the baseline.   DISCHARGE MEDICATIONS: None.   DISCHARGE DIET: Low sodium.   DISCHARGE ACTIVITY: As tolerated.   DISCHARGE INSTRUCTIONS AND FOLLOW UP:  1. Patient was instructed to follow up with oncology, Dr. Grayland Ormond, on 02/02 as schedule.  2. He will need follow up with Dr. Jefm Bryant from rheumatology in 2 to 3 weeks.  3. He will need new primary care physician,  Dr. Tullo, follow up in 1 to 2 weeks.  4. He is being discharged to [facility] for rehabilitation where he will need physical therapy evaluation and management while at the facility.   TOTAL TIME DISCHARGING THIS PATIENT: 45 minutes.   ____________________________ Vipul S. Shah, MD vss:cms D: 10/02/2011 15:04:55 ET T: 10/02/2011 15:34:53 ET JOB#: [id number]  cc: Vipul S. Shah, MD, <Dictator> Teresa Tullo, MD Timothy J. Finnegan, MD George W. Kernodle Jr., MD Michael E. Blocker, MD [facility] VIPUL S SHAH MD ELECTRONICALLY SIGNED 10/02/2011 16:08
